# Patient Record
Sex: Female | Born: 2000
Health system: Southern US, Community
[De-identification: ages and names within clinical notes are randomized; demographics above are authoritative.]

## PROBLEM LIST (undated history)

## (undated) DIAGNOSIS — L0291 Cutaneous abscess, unspecified: Secondary | ICD-10-CM

## (undated) DIAGNOSIS — D649 Anemia, unspecified: Secondary | ICD-10-CM

## (undated) DIAGNOSIS — M24859 Other specific joint derangements of unspecified hip, not elsewhere classified: Secondary | ICD-10-CM

## (undated) DIAGNOSIS — I7300 Raynaud's syndrome without gangrene: Secondary | ICD-10-CM

## (undated) DIAGNOSIS — A6920 Lyme disease, unspecified: Secondary | ICD-10-CM

## (undated) DIAGNOSIS — E58 Dietary calcium deficiency: Secondary | ICD-10-CM

## (undated) DIAGNOSIS — M419 Scoliosis, unspecified: Secondary | ICD-10-CM

## (undated) HISTORY — PX: TONSILLECTOMY: SUR1361

## (undated) HISTORY — PX: ADENOIDECTOMY: SUR15

---
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
## 2019-01-25 ENCOUNTER — Ambulatory Visit (HOSPITAL_COMMUNITY)
Admission: EM | Admit: 2019-01-25 | Discharge: 2019-01-25 | Disposition: A | Discharge status: Home / Self Care | Payer: BC Managed Care – PPO | Attending: Family Medicine | Admitting: Family Medicine

## 2019-01-25 ENCOUNTER — Other Ambulatory Visit: Payer: Self-pay

## 2019-01-25 ENCOUNTER — Encounter (HOSPITAL_COMMUNITY): Payer: Self-pay | Admitting: *Deleted

## 2019-01-25 DIAGNOSIS — L03011 Cellulitis of right finger: Secondary | ICD-10-CM

## 2019-01-25 MED ORDER — SULFAMETHOXAZOLE-TRIMETHOPRIM 800-160 MG PO TABS: 1.0000 | ORAL_TABLET | Freq: Two times a day (BID) | ORAL | 0 refills | Status: AC

## 2019-01-25 NOTE — Discharge Instructions (Signed)
May keep this dressing on until tomorrow.  Warm compresses regularly, 3-4 times a day even, this will help promote drainage.  Complete course of antibiotics.  Change dressing daily to keep covered and clean.  Ibuprofen as needed for pain.  If symptoms worsen or do not improve in the next week to return to be seen or to follow up with your PCP.

## 2019-01-25 NOTE — ED Triage Notes (Signed)
C/O infection to right little finger x 2 days; paronychia noted.  States started after having artificial nails applied.

## 2019-01-25 NOTE — ED Provider Notes (Signed)
[facility]    CSN: [id number] Arrival date & time: 01/25/19  1009      History   Chief Complaint Chief Complaint  Patient presents with  . Hand Pain    HPI Amy Daugherty is a 18 y.o. female.   Amy Daugherty presents with complaints of pain, swelling and infection to right hand pinky finger. First noted it two days ago. She feels the swelling has mildly improved today. She had gotten her nails done at the salon a week ago. States she does bite her nails. No significant drainage but does see some drainage on her bandage. Pain 10/10, radiates down the finger to the hand. Pad of finger is still soft with minimal tenderness. Has had similar in the past. History of anemia, lyme disease, raynaud, scoliosis.     ROS per HPI, negative if not otherwise mentioned.      Past Medical History:  Diagnosis Date  . Anemia   . Lyme disease   . Raynaud disease   . Scoliosis   . Snapping hip syndrome     There are no active problems to display for this patient.   Past Surgical History:  Procedure Laterality Date  . ADENOIDECTOMY      OB History   No obstetric history on file.      Home Medications    Prior to Admission medications   Medication Sig Start Date End Date Taking? Authorizing Provider  acetaminophen (TYLENOL) 500 MG tablet Take 500 mg by mouth every 6 (six) hours as needed for pain.    [provider]  cetirizine-pseudoephedrine (ZYRTEC-D) 5-120 MG tablet Take 1 tablet by mouth daily. 05/05/17   Tasia Catchings, Amy V, PA-C  fluticasone (FLONASE) 50 MCG/ACT nasal spray Place 2 sprays into both nostrils daily. 05/05/17   Ok Edwards, PA-C  ibuprofen (ADVIL,MOTRIN) 600 MG tablet Take 1 tab PO Q6h x 3 days then Q6h prn 05/25/12   Kristen Cardinal, NP  naproxen sodium (ANAPROX) 220 MG tablet Take 220 mg by mouth 2 (two) times daily with a meal.    [provider]  phenol (CHLORASEPTIC) 1.4 % LIQD Use as directed 1 spray in the mouth or throat as needed for  throat irritation / pain. 03/03/17   Tasia Catchings, Amy V, PA-C  sulfamethoxazole-trimethoprim (BACTRIM DS) 800-160 MG tablet Take 1 tablet by mouth 2 (two) times daily for 7 days. 01/25/19 02/01/19  Zigmund Gottron, NP    Family History Family History  Problem Relation Age of Onset  . Hypertension Mother   . Ulcerative colitis Mother     Social History Social History   Tobacco Use  . Smoking status: Never Smoker  . Smokeless tobacco: Never Used  Substance Use Topics  . Alcohol use: Yes    Comment: occasionally  . Drug use: Yes    Types: Marijuana     Allergies   Amoxicillin   Review of Systems Review of Systems   Physical Exam Triage Vital Signs ED Triage Vitals  Enc Vitals Group     BP 01/25/19 1034 123/73     Pulse Rate 01/25/19 1034 85     Resp 01/25/19 1034 16     Temp 01/25/19 1034 98.2 F (36.8 C)     Temp Source 01/25/19 1034 Other     SpO2 01/25/19 1034 97 %     Weight --      Height --      Head Circumference --      Peak  Flow --      Pain Score 01/25/19 1036 9     Pain Loc --      Pain Edu? --      Excl. in GC? --    No data found.  Updated Vital Signs BP 123/73   Pulse 85   Temp 98.2 F (36.8 C) (Other (Comment))   Resp 16   SpO2 97%   Visual Acuity Right Eye Distance:   Left Eye Distance:   Bilateral Distance:    Right Eye Near:   Left Eye Near:    Bilateral Near:     Physical Exam Constitutional:      General: She is not in acute distress.    Appearance: She is well-developed.  Cardiovascular:     Rate and Rhythm: Normal rate.  Pulmonary:     Effort: Pulmonary effort is normal.  Musculoskeletal:     Comments: Right pinky with obvious and significant paronychia with pus collection surrounding nail bed with large swelling and redness; pad swollen yet remains soft   Skin:    General: Skin is warm and dry.  Neurological:     Mental Status: She is alert and oriented to person, place, and time.      UC Treatments / Results  Labs  (all labs ordered are listed, but only abnormal results are displayed) Labs Reviewed - No data to display  EKG   Radiology No results found.  Procedures Incision and Drainage  Date/Time: 01/25/2019 11:15 AM Performed by: Georgetta HaberBurky, Clemmie Marxen B, NP Authorized by: Elvina SidleLauenstein, Kurt, MD   Consent:    Consent obtained:  Verbal   Consent given by:  Patient   Risks discussed:  Bleeding, pain, infection and incomplete drainage   Alternatives discussed:  Alternative treatment, observation, referral and no treatment Location:    Type:  Abscess   Location:  Upper extremity   Upper extremity location:  Finger   Finger location:  L small finger Pre-procedure details:    Skin preparation:  Antiseptic wash Anesthesia (see MAR for exact dosages):    Anesthesia method:  Topical application   Topical anesthesia: pain ease freeze spray  Procedure type:    Complexity:  Simple Procedure details:    Incision types:  Stab incision   Scalpel blade:  11   Drainage:  Purulent and bloody   Drainage amount:  Copious   Wound treatment:  Wound left open   Packing materials:  None Post-procedure details:    Patient tolerance of procedure:  Tolerated well, no immediate complications   (including critical care time)  Medications Ordered in UC Medications - No data to display  Initial Impression / Assessment and Plan / UC Course  I have reviewed the triage vital signs and the nursing notes.  Pertinent labs & imaging results that were available during my care of the patient were reviewed by me and considered in my medical decision making (see chart for details).     Significant output from paronychia s/p stab incision, patient tolerated well. Wound care discussed. Bactrim provided. Return precautions provided. Patient verbalized understanding and agreeable to plan.   Final Clinical Impressions(s) / UC Diagnoses   Final diagnoses:  Paronychia of finger of right hand     Discharge Instructions      May keep this dressing on until tomorrow.  Warm compresses regularly, 3-4 times a day even, this will help promote drainage.  Complete course of antibiotics.  Change dressing daily to keep covered and clean.  Ibuprofen  as needed for pain.  If symptoms worsen or do not improve in the next week to return to be seen or to follow up with your PCP.     ED Prescriptions    Medication Sig Dispense Auth. Provider   sulfamethoxazole-trimethoprim (BACTRIM DS) 800-160 MG tablet Take 1 tablet by mouth 2 (two) times daily for 7 days. 14 tablet Georgetta Haber, NP     PDMP not reviewed this encounter.   Georgetta Haber, NP 01/25/19 1119

## 2019-02-16 DIAGNOSIS — F4322 Adjustment disorder with anxiety: Secondary | ICD-10-CM | POA: Diagnosis not present

## 2019-03-02 DIAGNOSIS — F4322 Adjustment disorder with anxiety: Secondary | ICD-10-CM | POA: Diagnosis not present

## 2019-03-16 DIAGNOSIS — F4322 Adjustment disorder with anxiety: Secondary | ICD-10-CM | POA: Diagnosis not present

## 2019-03-23 DIAGNOSIS — F4322 Adjustment disorder with anxiety: Secondary | ICD-10-CM | POA: Diagnosis not present

## 2019-04-06 DIAGNOSIS — F4322 Adjustment disorder with anxiety: Secondary | ICD-10-CM | POA: Diagnosis not present

## 2019-04-20 DIAGNOSIS — F4322 Adjustment disorder with anxiety: Secondary | ICD-10-CM | POA: Diagnosis not present

## 2019-05-12 DIAGNOSIS — F4322 Adjustment disorder with anxiety: Secondary | ICD-10-CM | POA: Diagnosis not present

## 2019-05-18 DIAGNOSIS — F4322 Adjustment disorder with anxiety: Secondary | ICD-10-CM | POA: Diagnosis not present

## 2019-07-11 ENCOUNTER — Ambulatory Visit: Payer: BC Managed Care – PPO | Attending: Internal Medicine

## 2019-07-11 DIAGNOSIS — Z23 Encounter for immunization: Secondary | ICD-10-CM | POA: Insufficient documentation

## 2019-07-11 NOTE — Progress Notes (Signed)
   Covid-19 Vaccination Clinic  Name:  Amy Daugherty    MRN: [id number] DOB: 03/10/2001  07/11/2019  [name] was observed post Covid-19 immunization for 15 minutes without incident. She was provided with Vaccine Information Sheet and instruction to access the V-Safe system.   [name] was instructed to call 911 with any severe reactions post vaccine: Marland Kitchen Difficulty breathing  . Swelling of face and throat  . A fast heartbeat  . A bad rash all over body  . Dizziness and weakness   Immunizations Administered    Name Date Dose VIS Date Route   Pfizer COVID-19 Vaccine 07/11/2019 11:35 AM 0.3 mL 04/17/2019 Intramuscular   Manufacturer: [employer], [employer]   Lot: [id number]   NDC: [address]-4

## 2019-08-01 ENCOUNTER — Ambulatory Visit: Payer: BC Managed Care – PPO | Attending: Internal Medicine

## 2019-08-01 DIAGNOSIS — Z23 Encounter for immunization: Secondary | ICD-10-CM

## 2019-08-01 NOTE — Progress Notes (Signed)
   Covid-19 Vaccination Clinic  Name:  Amy Daugherty    MRN: [id number] DOB: 12/24/2000  08/01/2019  [name] was observed post Covid-19 immunization for 15 minutes without incident. She was provided with Vaccine Information Sheet and instruction to access the V-Safe system.   [name] was instructed to call 911 with any severe reactions post vaccine: Marland Kitchen Difficulty breathing  . Swelling of face and throat  . A fast heartbeat  . A bad rash all over body  . Dizziness and weakness   Immunizations Administered    Name Date Dose VIS Date Route   Pfizer COVID-19 Vaccine 08/01/2019  1:09 PM 0.3 mL 04/17/2019 Intramuscular   Manufacturer: [employer], [employer]   Lot: [id number]   NDC: [address]-1

## 2019-10-16 DIAGNOSIS — Z3009 Encounter for other general counseling and advice on contraception: Secondary | ICD-10-CM | POA: Diagnosis not present

## 2019-10-16 DIAGNOSIS — R102 Pelvic and perineal pain: Secondary | ICD-10-CM | POA: Diagnosis not present

## 2019-10-16 DIAGNOSIS — Z30432 Encounter for removal of intrauterine contraceptive device: Secondary | ICD-10-CM | POA: Diagnosis not present

## 2019-11-19 ENCOUNTER — Other Ambulatory Visit: Payer: Self-pay

## 2019-11-19 ENCOUNTER — Ambulatory Visit (HOSPITAL_COMMUNITY)
Admission: EM | Admit: 2019-11-19 | Discharge: 2019-11-19 | Disposition: A | Discharge status: Home / Self Care | Payer: BC Managed Care – PPO

## 2019-11-19 ENCOUNTER — Encounter (HOSPITAL_COMMUNITY): Payer: Self-pay

## 2019-11-19 DIAGNOSIS — L02411 Cutaneous abscess of right axilla: Secondary | ICD-10-CM | POA: Diagnosis not present

## 2019-11-19 DIAGNOSIS — M79621 Pain in right upper arm: Secondary | ICD-10-CM

## 2019-11-19 MED ORDER — DOXYCYCLINE HYCLATE 100 MG PO CAPS: 100.0000 mg | ORAL_CAPSULE | Freq: Two times a day (BID) | ORAL | 0 refills | Status: DC

## 2019-11-19 MED ORDER — NAPROXEN 500 MG PO TABS: 500.0000 mg | ORAL_TABLET | Freq: Two times a day (BID) | ORAL | 0 refills | Status: DC

## 2019-11-19 NOTE — ED Triage Notes (Signed)
Pt is here with a abscess under her right armpit that staretd 4 days ago, pt has used boil ease to relieve discomfort.

## 2019-11-19 NOTE — ED Provider Notes (Signed)
[facility]   MRN: [id number] DOB: [birth year]  Subjective:   Amy Daugherty is a 19 y.o. female presenting for 4-day history of worsening boil of the right armpit.  Patient has been using topical treatment with minimal relief.  Feels the pain radiating outwardly from the boil.  Has not gotten it to drain.  Has never had to have incision and drainage before.  No current facility-administered medications for this encounter.  Current Outpatient Medications:  .  acetaminophen (TYLENOL) 500 MG tablet, Take 500 mg by mouth every 6 (six) hours as needed for pain., Disp: , Rfl:  .  cetirizine-pseudoephedrine (ZYRTEC-D) 5-120 MG tablet, Take 1 tablet by mouth daily., Disp: 15 tablet, Rfl: 0 .  fluticasone (FLONASE) 50 MCG/ACT nasal spray, Place 2 sprays into both nostrils daily., Disp: 1 g, Rfl: 0 .  ibuprofen (ADVIL,MOTRIN) 600 MG tablet, Take 1 tab PO Q6h x 3 days then Q6h prn, Disp: 30 tablet, Rfl: 0 .  naproxen sodium (ANAPROX) 220 MG tablet, Take 220 mg by mouth 2 (two) times daily with a meal., Disp: , Rfl:  .  phenol (CHLORASEPTIC) 1.4 % LIQD, Use as directed 1 spray in the mouth or throat as needed for throat irritation / pain., Disp: 1 Bottle, Rfl: 0   Allergies  Allergen Reactions  . Amoxicillin Hives    Past Medical History:  Diagnosis Date  . Anemia   . Lyme disease   . Raynaud disease   . Scoliosis   . Snapping hip syndrome      Past Surgical History:  Procedure Laterality Date  . ADENOIDECTOMY      Family History  Problem Relation Age of Onset  . Hypertension Mother   . Ulcerative colitis Mother     Social History   Tobacco Use  . Smoking status: Never Smoker  . Smokeless tobacco: Never Used  Vaping Use  . Vaping Use: Every day  Substance Use Topics  . Alcohol use: Yes    Comment: occasionally  . Drug use: Yes    Types: Marijuana    ROS   Objective:   Vitals: BP 114/70 (BP Location: Left Arm)   Pulse 89   Temp 98.2 F (36.8 C) (Oral)    Resp 17   Wt 149 lb 9.6 oz (67.9 kg)   SpO2 99%   Physical Exam Constitutional:      General: She is not in acute distress.    Appearance: Normal appearance. She is well-developed. She is not ill-appearing, toxic-appearing or diaphoretic.  HENT:     Head: Normocephalic and atraumatic.     Nose: Nose normal.     Mouth/Throat:     Mouth: Mucous membranes are moist.     Pharynx: Oropharynx is clear.  Eyes:     General: No scleral icterus.       Right eye: No discharge.        Left eye: No discharge.     Extraocular Movements: Extraocular movements intact.     Pupils: Pupils are equal, round, and reactive to light.  Cardiovascular:     Rate and Rhythm: Normal rate.  Pulmonary:     Effort: Pulmonary effort is normal.  Skin:    General: Skin is warm and dry.     Comments: ~2cm abscess with central fluctuance of right axillary region.  There is warmth, erythema and tenderness.  Neurological:     General: No focal deficit present.     Mental Status: She is alert and oriented  to person, place, and time.  Psychiatric:        Mood and Affect: Mood normal.        Behavior: Behavior normal.        Thought Content: Thought content normal.        Judgment: Judgment normal.     PROCEDURE NOTE: I&D of Abscess Verbal consent obtained. Local anesthesia with 3cc of 2% lidocaine with epinephrine. Site cleansed with chlorhexidine. Incision of 1cm was made using a 11 blade, discharge of ~3cc pus and serosanguinous fluid. Wound cavity was explored and loculations loosened using curved hemostats. Cleansed and dressed.   Assessment and Plan :   PDMP not reviewed this encounter.  1. Abscess of axilla, right   2. Axillary pain, right    Abscesses successfully drained.  No packing was placed.  Counseled patient on wound care.  Patient is to start doxycycline, naproxen for pain and inflammation. Counseled patient on potential for adverse effects with medications prescribed/recommended today, ER  and return-to-clinic precautions discussed, patient verbalized understanding.   Wallis Bamberg, [place] 11/20/19 [phone]

## 2019-11-19 NOTE — Discharge Instructions (Addendum)
Please change your dressing 2-3 times daily. Do not apply any ointments or creams. Each time you change your dressing, make sure you clean gently around the perimeter of the wound with gentle soap and warm water. Pat your wound dry and let it air out if possible for 1-2 hours before reapplying another dressing.  °

## 2019-12-18 DIAGNOSIS — Z01419 Encounter for gynecological examination (general) (routine) without abnormal findings: Secondary | ICD-10-CM | POA: Diagnosis not present

## 2020-04-10 ENCOUNTER — Ambulatory Visit (INDEPENDENT_AMBULATORY_CARE_PROVIDER_SITE_OTHER): Payer: BLUE CROSS/BLUE SHIELD

## 2020-04-10 ENCOUNTER — Other Ambulatory Visit: Payer: Self-pay

## 2020-04-10 ENCOUNTER — Encounter (HOSPITAL_COMMUNITY): Payer: Self-pay | Admitting: *Deleted

## 2020-04-10 ENCOUNTER — Ambulatory Visit (HOSPITAL_COMMUNITY)
Admission: EM | Admit: 2020-04-10 | Discharge: 2020-04-10 | Disposition: A | Discharge status: Home / Self Care | Payer: BLUE CROSS/BLUE SHIELD

## 2020-04-10 DIAGNOSIS — L02411 Cutaneous abscess of right axilla: Secondary | ICD-10-CM

## 2020-04-10 DIAGNOSIS — M79675 Pain in left toe(s): Secondary | ICD-10-CM

## 2020-04-10 DIAGNOSIS — S92532A Displaced fracture of distal phalanx of left lesser toe(s), initial encounter for closed fracture: Secondary | ICD-10-CM | POA: Diagnosis not present

## 2020-04-10 HISTORY — DX: Cutaneous abscess, unspecified: L02.91

## 2020-04-10 HISTORY — DX: Dietary calcium deficiency: E58

## 2020-04-10 MED ORDER — DOXYCYCLINE HYCLATE 100 MG PO CAPS: 100.0000 mg | ORAL_CAPSULE | Freq: Two times a day (BID) | ORAL | 0 refills | Status: DC

## 2020-04-10 NOTE — ED Provider Notes (Signed)
[place]    CSN: [id number] Arrival date & time: 04/10/20  1003      History   Chief Complaint Chief Complaint  Patient presents with  . Abscess    HPI Amy Daugherty is a 19 y.o. female.   Presenting today with a painful red mass under right arm below axilla that has been worsening for over a week. Has been doing warm compresses to the area and has expressed a fair amount of thick drainage so far. Denies fever, chills, sweats, rashes elsewhere. No injury to the area. Also having some left 5th toe pain for over a week after reportedly breaking the toe and pushing it back into place at home. Has continued stiffness and swelling in this toe now but no discoloration, numbness, tingling. Hx of raynauds but this toe feels different than just that.      Past Medical History:  Diagnosis Date  . Abscess   . Anemia   . Calcium deficiency   . Lyme disease   . Raynaud disease   . Scoliosis   . Snapping hip syndrome     There are no problems to display for this patient.   Past Surgical History:  Procedure Laterality Date  . ADENOIDECTOMY    . TONSILLECTOMY      OB History   No obstetric history on file.      Home Medications    Prior to Admission medications   Medication Sig Start Date End Date Taking? Authorizing Provider  Aspirin-Acetaminophen-Caffeine (EXCEDRIN PO) Take by mouth.   Yes [provider]  acetaminophen (TYLENOL) 500 MG tablet Take 500 mg by mouth every 6 (six) hours as needed for pain.    [provider]  cetirizine-pseudoephedrine (ZYRTEC-D) 5-120 MG tablet Take 1 tablet by mouth daily. 05/05/17   Ok Edwards, PA-C  COVID-19 Specimen Collection KIT TEST AS DIRECETD 06/08/19   [provider]  doxycycline (VIBRAMYCIN) 100 MG capsule Take 1 capsule (100 mg total) by mouth 2 (two) times daily. 04/10/20   Volney American, PA-C  fluticasone [facility]) 50 MCG/ACT nasal spray Place 2 sprays into both nostrils daily.  05/05/17   Ok Edwards, PA-C  ibuprofen (ADVIL,MOTRIN) 600 MG tablet Take 1 tab PO Q6h x 3 days then Q6h prn 05/25/12   Kristen Cardinal, NP  Multiple Vitamin (MULTIVITAMIN) capsule Take 1 capsule by mouth daily.    [provider]  naproxen (NAPROSYN) 500 MG tablet Take 1 tablet (500 mg total) by mouth 2 (two) times daily with a meal. 11/19/19   Jaynee Eagles, PA-C  naproxen sodium (ANAPROX) 220 MG tablet Take 220 mg by mouth 2 (two) times daily with a meal.    [provider]  phenol (CHLORASEPTIC) 1.4 % LIQD Use as directed 1 spray in the mouth or throat as needed for throat irritation / pain. 03/03/17   Tasia Catchings, Amy V, PA-C  PHEXXI 1.8-1-0.4 % GEL Place vaginally as directed. 10/19/19   [provider]    Family History Family History  Problem Relation Age of Onset  . Hypertension Mother   . Ulcerative colitis Mother   . Healthy Father     Social History Social History   Tobacco Use  . Smoking status: Never Smoker  . Smokeless tobacco: Never Used  Vaping Use  . Vaping Use: Every day  Substance Use Topics  . Alcohol use: Yes    Comment: occasionally  . Drug use: Yes    Types: Marijuana  Allergies   Amoxicillin   Review of Systems Review of Systems PER HPI    Physical Exam Triage Vital Signs ED Triage Vitals  Enc Vitals Group     BP 04/10/20 1026 136/85     Pulse Rate 04/10/20 1026 85     Resp 04/10/20 1026 16     Temp 04/10/20 1026 98.2 F (36.8 C)     Temp Source 04/10/20 1026 Oral     SpO2 04/10/20 1026 95 %     Weight --      Height --      Head Circumference --      Peak Flow --      Pain Score 04/10/20 1027 3     Pain Loc --      Pain Edu? --      Excl. in [place]? --    No data found.  Updated Vital Signs BP 136/85   Pulse 85   Temp 98.2 F (36.8 C) (Oral)   Resp 16   LMP 04/08/2020 (Exact Date)   SpO2 95%   Visual Acuity Right Eye Distance:   Left Eye Distance:   Bilateral Distance:    Right Eye Near:   Left Eye Near:     Bilateral Near:     Physical Exam Vitals and nursing note reviewed.  Constitutional:      Appearance: Normal appearance. She is not ill-appearing.  HENT:     Head: Atraumatic.  Eyes:     Extraocular Movements: Extraocular movements intact.     Conjunctiva/sclera: Conjunctivae normal.  Cardiovascular:     Rate and Rhythm: Normal rate and regular rhythm.     Heart sounds: Normal heart sounds.  Pulmonary:     Effort: Pulmonary effort is normal.     Breath sounds: Normal breath sounds.  Musculoskeletal:        General: Swelling (diffuse swelling, stiffness and mild darkened coloration of left 5th toe) and signs of injury present. Normal range of motion.     Cervical back: Normal range of motion and neck supple.  Skin:    General: Skin is warm and dry.     Comments: 1 cm x 2 cm fluctuant mass below right axilla, ttp  Neurological:     Mental Status: She is alert and oriented to person, place, and time.  Psychiatric:        Mood and Affect: Mood normal.        Thought Content: Thought content normal.        Judgment: Judgment normal.      UC Treatments / Results  Labs (all labs ordered are listed, but only abnormal results are displayed) Labs Reviewed - No data to display  EKG   Radiology DG Toe 5th Left  Result Date: 04/10/2020 CLINICAL DATA:  One week of fifth toe pain. Deformity and stiffness. EXAM: DG TOE 5TH LEFT COMPARISON:  None. 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 Probable subtle fracture through the base of the proximal fifth phalanx best seen on the AP view. No other abnormalities. 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 Probable subtle fracture through the base of the proximal fifth phalanx only seen on the AP view. Electronically Signed   By: Dorise Bullion III M.D   On: 04/10/2020 11:27     Procedures Incision and Drainage  Date/Time: 04/10/2020 3:11 PM Performed by: Orene Desanctis,  Lilia Argue, PA-C Authorized by: Volney American, PA-C   Consent:    Consent obtained:  Verbal   Consent given by:  Patient   Risks discussed:  Incomplete drainage, infection, bleeding and pain   Alternatives discussed:  Alternative treatment Location:    Type:  Abscess   Location: right axilla. Pre-procedure details:    Skin preparation:  Chloraprep Anesthesia (see MAR for exact dosages):    Anesthesia method:  Local infiltration   Local anesthetic:  Lidocaine 1% WITH epi Procedure type:    Complexity:  Simple Procedure details:    Needle aspiration: no     Incision types:  Stab incision   Incision depth:  Dermal   Scalpel blade:  11   Wound management:  Probed and deloculated   Drainage:  Purulent   Drainage amount:  Moderate   Wound treatment:  Wound left open   Packing materials:  None Post-procedure details:    Patient tolerance of procedure:  Tolerated well, no immediate complications   (including critical care time)  Medications Ordered in UC Medications - No data to display  Initial Impression / Assessment and Plan / UC Course  I have reviewed the triage vital signs and the nursing notes.  Pertinent labs & imaging results that were available during my care of the patient were reviewed by me and considered in my medical decision making (see chart for details).     Abscess drained without complication, start doxycycline, hibiclens and wound care at home as discussed. X-ray left toe showing non displaced healing fracture. Discussed RICE, podiatry f/u if pain does not improve over time.   Final Clinical Impressions(s) / UC Diagnoses   Final diagnoses:  Abscess of axilla, right  Toe pain, left   Discharge Instructions   None    ED Prescriptions    Medication Sig Dispense Auth. Provider   doxycycline (VIBRAMYCIN) 100 MG capsule Take 1 capsule (100 mg  total) by mouth 2 (two) times daily. 14 capsule Volney American, [place]     PDMP not reviewed this encounter.   Volney American, [place] 04/10/20 1513

## 2020-04-10 NOTE — ED Triage Notes (Signed)
Pt c/o right axillary abscess onset approx 10 days ago.  States she has been draining out a great deal of infection.  Denies fevers.  States had abscess to same area approx 1 month ago with I&D.

## 2020-07-12 HISTORY — PX: DENTAL SURGERY: SHX609

## 2020-07-17 ENCOUNTER — Ambulatory Visit (HOSPITAL_COMMUNITY)
Admission: EM | Admit: 2020-07-17 | Discharge: 2020-07-17 | Disposition: A | Discharge status: Home / Self Care | Payer: BC Managed Care – PPO

## 2020-07-17 ENCOUNTER — Encounter (HOSPITAL_COMMUNITY): Payer: Self-pay | Admitting: Emergency Medicine

## 2020-07-17 DIAGNOSIS — K0889 Other specified disorders of teeth and supporting structures: Secondary | ICD-10-CM

## 2020-07-17 MED ORDER — LIDOCAINE VISCOUS HCL 2 % MT SOLN: 10.0000 mL | OROMUCOSAL | 0 refills | Status: DC | PRN

## 2020-07-17 NOTE — ED Triage Notes (Addendum)
Pt presents today with mouth pain that began after she had both wisdom teeth pulled on 07/12/20. She also mentioned striking her head on a cabinet yesterday, but feels she does not have a concussion, but has had a history of seven concussions in the past and knows how to handle them.

## 2020-07-17 NOTE — Discharge Instructions (Signed)
Follow-up with oral surgeon in the next 2 days for recheck

## 2020-07-17 NOTE — ED Provider Notes (Signed)
[place]    CSN: [id number] Arrival date & time: 07/17/20  1154      History   Chief Complaint Chief Complaint  Patient presents with  . Dental Pain    HPI Amy Daugherty is a 20 y.o. female.   Patient here today with 1-2 day history of acutely worsening dental pain radiating up to ears and down neck.  She states she had her wisdom teeth excised through oral surgery 5 days ago and was improving up until 2 days ago.  The pain is constant, throbbing, dull, severe.  She is currently on clindamycin and ibuprofen per oral surgeon, taking faithfully as prescribed.  She denies fever, chills, drainage in her mouth, difficulty swallowing, nausea or vomiting.  No known history of similar pain in the past.  Has had tonsils and adenoids removed in the past.     Past Medical History:  Diagnosis Date  . Abscess   . Anemia   . Calcium deficiency   . Lyme disease   . Raynaud disease   . Scoliosis   . Snapping hip syndrome     There are no problems to display for this patient.   Past Surgical History:  Procedure Laterality Date  . ADENOIDECTOMY    . DENTAL SURGERY  07/12/2020  . TONSILLECTOMY      OB History   No obstetric history on file.      Home Medications    Prior to Admission medications   Medication Sig Start Date End Date Taking? Authorizing Provider  clindamycin (CLEOCIN) 300 MG capsule Take 300 mg by mouth every 8 (eight) hours. 07/12/20  Yes [provider]  dexamethasone (DECADRON) 4 MG tablet Take 4 mg by mouth 3 (three) times daily. 07/12/20  Yes [provider]  HYDROcodone-acetaminophen (NORCO/VICODIN) 5-325 MG tablet Take 1 tablet by mouth every 4 (four) hours as needed. 07/12/20  Yes [provider]  ibuprofen (ADVIL,MOTRIN) 600 MG tablet Take 1 tab PO Q6h x 3 days then Q6h prn 05/25/12  Yes Brewer, Mindy, NP  lidocaine (XYLOCAINE) 2 % solution Use as directed 10 mLs in the mouth or throat as needed for mouth pain. 07/17/20   Yes Volney American, PA-C  acetaminophen (TYLENOL) 500 MG tablet Take 500 mg by mouth every 6 (six) hours as needed for pain.    [provider]  Aspirin-Acetaminophen-Caffeine (EXCEDRIN PO) Take by mouth.    [provider]  cetirizine-pseudoephedrine (ZYRTEC-D) 5-120 MG tablet Take 1 tablet by mouth daily. 05/05/17   Ok Edwards, PA-C  COVID-19 Specimen Collection KIT TEST AS DIRECETD 06/08/19   [provider]  doxycycline (VIBRAMYCIN) 100 MG capsule Take 1 capsule (100 mg total) by mouth 2 (two) times daily. 04/10/20   Volney American, PA-C  fluticasone [facility]) 50 MCG/ACT nasal spray Place 2 sprays into both nostrils daily. 05/05/17   Tasia Catchings, Amy V, PA-C  Multiple Vitamin (MULTIVITAMIN) capsule Take 1 capsule by mouth daily.    [provider]  naproxen (NAPROSYN) 500 MG tablet Take 1 tablet (500 mg total) by mouth 2 (two) times daily with a meal. 11/19/19   Jaynee Eagles, PA-C  naproxen sodium (ANAPROX) 220 MG tablet Take 220 mg by mouth 2 (two) times daily with a meal.    [provider]  phenol (CHLORASEPTIC) 1.4 % LIQD Use as directed 1 spray in the mouth or throat as needed for throat irritation / pain. 03/03/17   Tasia Catchings, Amy V, PA-C  PHEXXI 1.8-1-0.4 %  GEL Place vaginally as directed. 10/19/19   [provider]    Family History Family History  Problem Relation Age of Onset  . Hypertension Mother   . Ulcerative colitis Mother   . Healthy Father     Social History Social History   Tobacco Use  . Smoking status: Never Smoker  . Smokeless tobacco: Never Used  Vaping Use  . Vaping Use: Every day  Substance Use Topics  . Alcohol use: Not Currently  . Drug use: Not Currently    Types: Marijuana     Allergies   Amoxicillin   Review of Systems Review of Systems Per HPI  Physical Exam Triage Vital Signs ED Triage Vitals  Enc Vitals Group     BP 07/17/20 1217 133/76     Pulse Rate 07/17/20 1217 79     Resp  07/17/20 1217 18     Temp 07/17/20 1217 98.1 F (36.7 C)     Temp Source 07/17/20 1217 Oral     SpO2 07/17/20 1217 99 %     Weight 07/17/20 1209 150 lb (68 kg)     Height 07/17/20 1209 5' 9"  (1.753 m)     Head Circumference --      Peak Flow --      Pain Score 07/17/20 1209 9     Pain Loc --      Pain Edu? --      Excl. in [place]? --    No data found.  Updated Vital Signs BP 133/76 (BP Location: Left Arm)   Pulse 79   Temp 98.1 F (36.7 C) (Oral)   Resp 18   Ht 5' 9"  (1.753 m)   Wt 150 lb (68 kg)   LMP 07/16/2020   SpO2 99%   BMI 22.15 kg/m   Visual Acuity Right Eye Distance:   Left Eye Distance:   Bilateral Distance:    Right Eye Near:   Left Eye Near:    Bilateral Near:     Physical Exam Vitals and nursing note reviewed.  Constitutional:      Appearance: Normal appearance. She is not ill-appearing.  HENT:     Head: Atraumatic.     Right Ear: Tympanic membrane and external ear normal.     Left Ear: Tympanic membrane and external ear normal.     Nose: Nose normal.     Mouth/Throat:     Mouth: Mucous membranes are moist.     Pharynx: Oropharynx is clear.     Comments: No gingival erythema, edema, drainage, bleeding or other abnormalities noted within the oral cavity Eyes:     Extraocular Movements: Extraocular movements intact.     Conjunctiva/sclera: Conjunctivae normal.  Cardiovascular:     Rate and Rhythm: Normal rate and regular rhythm.     Heart sounds: Normal heart sounds.  Pulmonary:     Effort: Pulmonary effort is normal.     Breath sounds: Normal breath sounds.  Musculoskeletal:        General: Normal range of motion.     Cervical back: Normal range of motion and neck supple.  Lymphadenopathy:     Cervical: No cervical adenopathy.  Skin:    General: Skin is warm and dry.  Neurological:     Mental Status: She is alert and oriented to person, place, and time.  Psychiatric:        Mood and Affect: Mood normal.        Thought Content: Thought  content normal.  Judgment: Judgment normal.      UC Treatments / Results  Labs (all labs ordered are listed, but only abnormal results are displayed) Labs Reviewed - No data to display  EKG   Radiology No results found.  Procedures Procedures (including critical care time)  Medications Ordered in UC Medications - No data to display  Initial Impression / Assessment and Plan / UC Course  I have reviewed the triage vital signs and the nursing notes.  Pertinent labs & imaging results that were available during my care of the patient were reviewed by me and considered in my medical decision making (see chart for details).     Vital signs and exam very reassuring today, already on antibiotics and anti-inflammatory pain medication through oral surgeon.  No evidence of acute infection today, suspect related to her recent oral surgery.  Will treat with viscous lidocaine additionally for as needed pain relief.  She will contact oral surgeon in the morning and try to be seen given her worsening symptoms.  Continue the medications as prescribed.  Follow-up for acutely worsening symptoms in meantime.  Final Clinical Impressions(s) / UC Diagnoses   Final diagnoses:  Pain, dental     Discharge Instructions     Follow-up with oral surgeon in the next 2 days for recheck    ED Prescriptions    Medication Sig Dispense Auth. Provider   lidocaine (XYLOCAINE) 2 % solution Use as directed 10 mLs in the mouth or throat as needed for mouth pain. 100 mL Volney American, [place]     PDMP not reviewed this encounter.   Volney American, [place] 07/17/20 1358

## 2020-09-16 ENCOUNTER — Ambulatory Visit (HOSPITAL_COMMUNITY)
Admission: EM | Admit: 2020-09-16 | Discharge: 2020-09-16 | Disposition: A | Discharge status: Home / Self Care | Payer: BC Managed Care – PPO | Attending: Emergency Medicine | Admitting: Emergency Medicine

## 2020-09-16 ENCOUNTER — Ambulatory Visit (INDEPENDENT_AMBULATORY_CARE_PROVIDER_SITE_OTHER): Payer: BC Managed Care – PPO

## 2020-09-16 ENCOUNTER — Encounter (HOSPITAL_COMMUNITY): Payer: Self-pay

## 2020-09-16 ENCOUNTER — Other Ambulatory Visit: Payer: Self-pay

## 2020-09-16 DIAGNOSIS — S60413A Abrasion of left middle finger, initial encounter: Secondary | ICD-10-CM

## 2020-09-16 DIAGNOSIS — M79642 Pain in left hand: Secondary | ICD-10-CM | POA: Diagnosis not present

## 2020-09-16 DIAGNOSIS — L03012 Cellulitis of left finger: Secondary | ICD-10-CM | POA: Diagnosis not present

## 2020-09-16 DIAGNOSIS — M79645 Pain in left finger(s): Secondary | ICD-10-CM

## 2020-09-16 DIAGNOSIS — L089 Local infection of the skin and subcutaneous tissue, unspecified: Secondary | ICD-10-CM | POA: Diagnosis not present

## 2020-09-16 DIAGNOSIS — S6992XA Unspecified injury of left wrist, hand and finger(s), initial encounter: Secondary | ICD-10-CM | POA: Diagnosis not present

## 2020-09-16 MED ORDER — DOXYCYCLINE HYCLATE 100 MG PO CAPS: 100.0000 mg | ORAL_CAPSULE | Freq: Two times a day (BID) | ORAL | 0 refills | Status: AC

## 2020-09-16 NOTE — ED Provider Notes (Signed)
[place]    CSN: [id number] Arrival date & time: 09/16/20  3086      History   Chief Complaint Chief Complaint  Patient presents with  . finger infection    HPI Amy Daugherty is a 20 y.o. female.   Patient here for evaluation of left index finger pain and swelling that has been ongoing for the past several days to weeks.  Reports frequently biting nails and gets infections but states that this 1 has not improved.  Reports finger has drained several times.  Reports pain now radiating into finger and hand.  Denies any redness, swelling, or decreased ROM.  Denies any trauma, injury, or other precipitating event.  Denies any specific alleviating or aggravating factors.  Denies any fevers, chest pain, shortness of breath, N/V/D, numbness, tingling, weakness, abdominal pain, or headaches.   ROS: As per HPI, all other pertinent ROS negative    The history is provided by the patient.    Past Medical History:  Diagnosis Date  . Abscess   . Anemia   . Calcium deficiency   . Lyme disease   . Raynaud disease   . Scoliosis   . Snapping hip syndrome     There are no problems to display for this patient.   Past Surgical History:  Procedure Laterality Date  . ADENOIDECTOMY    . DENTAL SURGERY  07/12/2020  . TONSILLECTOMY      OB History   No obstetric history on file.      Home Medications    Prior to Admission medications   Medication Sig Start Date End Date Taking? Authorizing Provider  doxycycline (VIBRAMYCIN) 100 MG capsule Take 1 capsule (100 mg total) by mouth 2 (two) times daily for 10 days. 09/16/20 09/26/20 Yes Pearson Forster, NP  acetaminophen (TYLENOL) 500 MG tablet Take 500 mg by mouth every 6 (six) hours as needed for pain.    [provider]  Aspirin-Acetaminophen-Caffeine (EXCEDRIN PO) Take by mouth.    [provider]  cetirizine-pseudoephedrine (ZYRTEC-D) 5-120 MG tablet Take 1 tablet by mouth daily. 05/05/17   Tasia Catchings, Amy V, PA-C   clindamycin (CLEOCIN) 300 MG capsule Take 300 mg by mouth every 8 (eight) hours. 07/12/20   [provider]  COVID-19 Specimen Collection KIT TEST AS DIRECETD 06/08/19   [provider]  dexamethasone (DECADRON) 4 MG tablet Take 4 mg by mouth 3 (three) times daily. 07/12/20   [provider]  fluticasone (FLONASE) 50 MCG/ACT nasal spray Place 2 sprays into both nostrils daily. 05/05/17   Tasia Catchings, Amy V, PA-C  HYDROcodone-acetaminophen (NORCO/VICODIN) 5-325 MG tablet Take 1 tablet by mouth every 4 (four) hours as needed. 07/12/20   [provider]  ibuprofen (ADVIL,MOTRIN) 600 MG tablet Take 1 tab PO Q6h x 3 days then Q6h prn 05/25/12   Kristen Cardinal, NP  lidocaine (XYLOCAINE) 2 % solution Use as directed 10 mLs in the mouth or throat as needed for mouth pain. 07/17/20   Volney American, PA-C  Multiple Vitamin (MULTIVITAMIN) capsule Take 1 capsule by mouth daily.    [provider]  naproxen (NAPROSYN) 500 MG tablet Take 1 tablet (500 mg total) by mouth 2 (two) times daily with a meal. 11/19/19   Jaynee Eagles, PA-C  naproxen sodium (ANAPROX) 220 MG tablet Take 220 mg by mouth 2 (two) times daily with a meal.    [provider]  phenol (CHLORASEPTIC) 1.4 % LIQD Use as directed 1 spray in the  mouth or throat as needed for throat irritation / pain. 03/03/17   Tasia Catchings, Amy V, PA-C  PHEXXI 1.8-1-0.4 % GEL Place vaginally as directed. 10/19/19   [provider]    Family History Family History  Problem Relation Age of Onset  . Hypertension Mother   . Ulcerative colitis Mother   . Healthy Father     Social History Social History   Tobacco Use  . Smoking status: Never Smoker  . Smokeless tobacco: Never Used  Vaping Use  . Vaping Use: Every day  Substance Use Topics  . Alcohol use: Not Currently  . Drug use: Not Currently    Types: Marijuana     Allergies   Amoxicillin   Review of Systems Review of Systems  Musculoskeletal: Positive  for myalgias.  All other systems reviewed and are negative.    Physical Exam Triage Vital Signs ED Triage Vitals  Enc Vitals Group     BP 09/16/20 0833 129/86     Pulse Rate 09/16/20 0833 94     Resp 09/16/20 0833 19     Temp 09/16/20 0833 98.1 F (36.7 C)     Temp Source 09/16/20 0833 Oral     SpO2 09/16/20 0833 100 %     Weight --      Height --      Head Circumference --      Peak Flow --      Pain Score 09/16/20 0831 4     Pain Loc --      Pain Edu? --      Excl. in [place]? --    No data found.  Updated Vital Signs BP 129/86 (BP Location: Left Arm)   Pulse 94   Temp 98.1 F (36.7 C) (Oral)   Resp 19   LMP 08/15/2020 (Approximate)   SpO2 100%   Visual Acuity Right Eye Distance:   Left Eye Distance:   Bilateral Distance:    Right Eye Near:   Left Eye Near:    Bilateral Near:     Physical Exam Vitals and nursing note reviewed.  Constitutional:      General: She is not in acute distress.    Appearance: Normal appearance. She is not ill-appearing, toxic-appearing or diaphoretic.  HENT:     Head: Normocephalic and atraumatic.  Eyes:     Conjunctiva/sclera: Conjunctivae normal.  Cardiovascular:     Rate and Rhythm: Normal rate.     Pulses: Normal pulses.  Pulmonary:     Effort: Pulmonary effort is normal.  Abdominal:     General: Abdomen is flat.  Musculoskeletal:        General: Normal range of motion.     Cervical back: Normal range of motion.  Skin:    General: Skin is warm and dry.     Findings: Abscess (Paronychia to left index finger) and erythema present.     Comments: See photo  Neurological:     General: No focal deficit present.     Mental Status: She is alert and oriented to person, place, and time.  Psychiatric:        Mood and Affect: Mood normal.        UC Treatments / Results  Labs (all labs ordered are listed, but only abnormal results are displayed) Labs Reviewed - No data to display  EKG   Radiology DG Finger Middle  Left  Result Date: 09/16/2020 CLINICAL DATA:  Infection.  Finger and hand pain. EXAM: LEFT MIDDLE FINGER  2+V COMPARISON:  None. FINDINGS: There is no evidence of fracture or dislocation. There is no evidence of arthropathy or other focal bone abnormality. Soft tissues are unremarkable. IMPRESSION: Negative. Electronically Signed   By: Rolm Baptise M.D.   On: 09/16/2020 09:05    Procedures Procedures (including critical care time)  Medications Ordered in UC Medications - No data to display  Initial Impression / Assessment and Plan / UC Course  I have reviewed the triage vital signs and the nursing notes.  Pertinent labs & imaging results that were available during my care of the patient were reviewed by me and considered in my medical decision making (see chart for details).    Assessment negative for red flags or concerns including felons.  As patient has been having pain that radiates down finger into her hand we will check an x-ray to rule out any significant infection including septic arthritis or osteomyelitis.  X-ray was negative.  Paronychia has been draining on its own at home so we will not attempt to drain it here.  Doxycycline twice daily x10 days.  Continue doing Epsom salt soaks.  May take ibuprofen and/or Tylenol as needed for pain and fever.  May ice finger for comfort.  Recommend follow-up for worsening symptoms. Final Clinical Impressions(s) / UC Diagnoses   Final diagnoses:  Paronychia of left middle finger  Finger pain, left     Discharge Instructions     Take the doxycycline 1 pill twice a day for the next 10 days.  Take ibuprofen and/or Tylenol as needed for pain relief and fever reduction.  Soak your hand in Epson salt soak 3-4 times a day.  You can also ice your hand 3-4 times a day as needed for comfort.  If you develop any  worsening redness, swelling, red streaks, or a fever, please go directly to the emergency room for further evaluation.  Return or go to  the Emergency Department if symptoms worsen or do not improve in the next few days.     ED Prescriptions    Medication Sig Dispense Auth. Provider   doxycycline (VIBRAMYCIN) 100 MG capsule Take 1 capsule (100 mg total) by mouth 2 (two) times daily for 10 days. 20 capsule Pearson Forster, NP     PDMP not reviewed this encounter.   Pearson Forster, NP 09/16/20 [phone]

## 2020-09-16 NOTE — ED Triage Notes (Signed)
Pt in with c/o left finger pain that started last week after she bit her nail  Redness, swelling, and drainage noted

## 2020-09-16 NOTE — Discharge Instructions (Signed)
Take the doxycycline 1 pill twice a day for the next 10 days.  Take ibuprofen and/or Tylenol as needed for pain relief and fever reduction.  Soak your hand in Epson salt soak 3-4 times a day.  You can also ice your hand 3-4 times a day as needed for comfort.  If you develop any  worsening redness, swelling, red streaks, or a fever, please go directly to the emergency room for further evaluation.  Return or go to the Emergency Department if symptoms worsen or do not improve in the next few days.

## 2020-11-15 DIAGNOSIS — Z3202 Encounter for pregnancy test, result negative: Secondary | ICD-10-CM | POA: Diagnosis not present

## 2020-11-15 DIAGNOSIS — Z30430 Encounter for insertion of intrauterine contraceptive device: Secondary | ICD-10-CM | POA: Diagnosis not present

## 2020-12-03 ENCOUNTER — Encounter (HOSPITAL_COMMUNITY): Payer: Self-pay | Admitting: *Deleted

## 2020-12-03 ENCOUNTER — Ambulatory Visit (HOSPITAL_COMMUNITY)
Admission: EM | Admit: 2020-12-03 | Discharge: 2020-12-03 | Disposition: A | Discharge status: Home / Self Care | Payer: BC Managed Care – PPO | Attending: Physician Assistant | Admitting: Physician Assistant

## 2020-12-03 ENCOUNTER — Other Ambulatory Visit: Payer: Self-pay

## 2020-12-03 DIAGNOSIS — K130 Diseases of lips: Secondary | ICD-10-CM

## 2020-12-03 DIAGNOSIS — B370 Candidal stomatitis: Secondary | ICD-10-CM

## 2020-12-03 DIAGNOSIS — L299 Pruritus, unspecified: Secondary | ICD-10-CM | POA: Diagnosis not present

## 2020-12-03 MED ORDER — NYSTATIN 100000 UNIT/ML MT SUSP: 5.0000 mL | Freq: Four times a day (QID) | OROMUCOSAL | 0 refills | Status: AC

## 2020-12-03 NOTE — ED Provider Notes (Signed)
[place]    CSN: [id number] Arrival date & time: 12/03/20  1000      History   Chief Complaint Chief Complaint  Patient presents with   Thrush    HPI Amy Daugherty is a 20 y.o. female.   Patient presents today with a several week history of white patches in her mouth.  Reports that initially she had a sore throat but this has since improved though she continues to have a white film intermittently in her mouth.  She is also developed a sore at her left lateral commissure and is concerned for yeast infection as she has had similar symptoms in different areas of her body in the past that resolved with antifungals.  She has tried Aquaphor and tea tree oil without improvement of symptoms.  She is also been drinking tea.  She denies any history of immunosuppression including HIV or diabetes.  She denies any difficulty swallowing, shortness of breath, fever, nausea, vomiting.  She denies any recent antibiotic use or steroid use.  She does report widespread pruritus and is unsure if this is related.  She does have anxiety and often has itching his sensation related to anxiety but states this has been more severe recently.   Past Medical History:  Diagnosis Date   Abscess    Anemia    Calcium deficiency    Lyme disease    Raynaud disease    Scoliosis    Snapping hip syndrome     There are no problems to display for this patient.   Past Surgical History:  Procedure Laterality Date   ADENOIDECTOMY     DENTAL SURGERY  07/12/2020   TONSILLECTOMY      OB History   No obstetric history on file.      Home Medications    Prior to Admission medications   Medication Sig Start Date End Date Taking? Authorizing Provider  nystatin (MYCOSTATIN) 100000 UNIT/ML suspension Take 5 mLs (500,000 Units total) by mouth 4 (four) times daily. 12/03/20  Yes Buford Gayler K, PA-C  cetirizine-pseudoephedrine (ZYRTEC-D) 5-120 MG tablet Take 1 tablet by mouth daily. 05/05/17   Tasia Catchings, Amy V,  PA-C  clindamycin (CLEOCIN) 300 MG capsule Take 300 mg by mouth every 8 (eight) hours. 07/12/20   [provider]  COVID-19 Specimen Collection KIT TEST AS DIRECETD 06/08/19   [provider]  dexamethasone (DECADRON) 4 MG tablet Take 4 mg by mouth 3 (three) times daily. 07/12/20   [provider]  fluticasone (FLONASE) 50 MCG/ACT nasal spray Place 2 sprays into both nostrils daily. 05/05/17   Ok Edwards, PA-C  ibuprofen (ADVIL,MOTRIN) 600 MG tablet Take 1 tab PO Q6h x 3 days then Q6h prn 05/25/12   Kristen Cardinal, NP  phenol (CHLORASEPTIC) 1.4 % LIQD Use as directed 1 spray in the mouth or throat as needed for throat irritation / pain. 03/03/17   Ok Edwards, PA-C    Family History Family History  Problem Relation Age of Onset   Hypertension Mother    Ulcerative colitis Mother    Healthy Father     Social History Social History   Tobacco Use   Smoking status: Never   Smokeless tobacco: Never  Vaping Use   Vaping Use: Every day  Substance Use Topics   Alcohol use: Not Currently   Drug use: Not Currently    Types: Marijuana     Allergies   Amoxicillin   Review of Systems Review of Systems  Constitutional:  Negative for activity change, appetite change, fatigue and fever.  HENT:  Positive for mouth sores. Negative for congestion, sinus pressure, sneezing and sore throat.   Respiratory:  Negative for cough and shortness of breath.   Cardiovascular:  Negative for chest pain.  Gastrointestinal:  Negative for abdominal pain, diarrhea, nausea and vomiting.    Physical Exam Triage Vital Signs ED Triage Vitals  Enc Vitals Group     BP 12/03/20 1016 122/60     Pulse Rate 12/03/20 1016 100     Resp 12/03/20 1016 18     Temp 12/03/20 1016 98.7 F (37.1 C)     Temp src --      SpO2 12/03/20 1016 100 %     Weight --      Height --      Head Circumference --      Peak Flow --      Pain Score 12/03/20 1017 4     Pain Loc --      Pain Edu? --      Excl.  in [place]? --    No data found.  Updated Vital Signs BP 122/60   Pulse 100   Temp 98.7 F (37.1 C)   Resp 18   SpO2 100%   Visual Acuity Right Eye Distance:   Left Eye Distance:   Bilateral Distance:    Right Eye Near:   Left Eye Near:    Bilateral Near:     Physical Exam Vitals reviewed.  Constitutional:      General: She is awake. She is not in acute distress.    Appearance: Normal appearance. She is normal weight. She is not ill-appearing.     Comments: Very pleasant female appears stated age in no acute distress  HENT:     Head: Normocephalic and atraumatic.     Right Ear: External ear normal.     Left Ear: External ear normal.     Nose:     Right Sinus: No maxillary sinus tenderness or frontal sinus tenderness.     Left Sinus: No maxillary sinus tenderness or frontal sinus tenderness.     Mouth/Throat:     Mouth: Mucous membranes are moist.     Dentition: Normal dentition.     Tongue: Lesions present. Tongue does not deviate from midline.     Pharynx: Uvula midline. No oropharyngeal exudate or posterior oropharyngeal erythema.     Comments: Scattered white patches noted posterior tongue and hard palate.  Fissure noted at oral commissure. Cardiovascular:     Rate and Rhythm: Normal rate and regular rhythm.     Heart sounds: Normal heart sounds, S1 normal and S2 normal. No murmur heard. Pulmonary:     Effort: Pulmonary effort is normal.     Breath sounds: Normal breath sounds. No wheezing, rhonchi or rales.     Comments: Clear to auscultation bilaterally Skin:    Findings: No rash.  Psychiatric:        Behavior: Behavior is cooperative.     UC Treatments / Results  Labs (all labs ordered are listed, but only abnormal results are displayed) Labs Reviewed - No data to display  EKG   Radiology No results found.  Procedures Procedures (including critical care time)  Medications Ordered in UC Medications - No data to display  Initial Impression /  Assessment and Plan / UC Course  I have reviewed the triage vital signs and the nursing notes.  Pertinent labs & imaging results that  were available during my care of the patient were reviewed by me and considered in my medical decision making (see chart for details).      Symptoms consistent with oral thrush.  Patient treated with nystatin.  Discussed potential utility of getting CBC particularly given widespread pruritus to rule out erythrocytosis but patient declined this today.  Discussed the importance of oral hygiene.  Discussed that if symptoms persist would need to consider blood work for further evaluation.  Strict return precautions given to which patient expressed understanding.  Final Clinical Impressions(s) / UC Diagnoses   Final diagnoses:  Oral thrush  Cheilitis  Pruritus     Discharge Instructions      Take nystatin 4 times a day.  Make sure to change your oral hygiene products including her toothbrush.  If you continue to have symptoms it would be worthwhile to be reevaluated to consider lab work as we discussed.  If anything worsens please return for reevaluation.  Follow-up with your primary care provider once you establish with them for further evaluation.     ED Prescriptions     Medication Sig Dispense Auth. Provider   nystatin (MYCOSTATIN) 100000 UNIT/ML suspension Take 5 mLs (500,000 Units total) by mouth 4 (four) times daily. 140 mL Keymani Mclean K, PA-C      PDMP not reviewed this encounter.   Terrilee Croak, PA-C 12/03/20 1053

## 2020-12-03 NOTE — ED Triage Notes (Signed)
Pt reports white film in mouth.

## 2020-12-03 NOTE — Discharge Instructions (Addendum)
Take nystatin 4 times a day.  Make sure to change your oral hygiene products including her toothbrush.  If you continue to have symptoms it would be worthwhile to be reevaluated to consider lab work as we discussed.  If anything worsens please return for reevaluation.  Follow-up with your primary care provider once you establish with them for further evaluation.

## 2021-01-19 ENCOUNTER — Ambulatory Visit (HOSPITAL_COMMUNITY)
Admission: EM | Admit: 2021-01-19 | Discharge: 2021-01-19 | Disposition: A | Discharge status: Home / Self Care | Payer: BC Managed Care – PPO

## 2021-01-19 ENCOUNTER — Other Ambulatory Visit: Payer: Self-pay

## 2021-01-19 DIAGNOSIS — K130 Diseases of lips: Secondary | ICD-10-CM | POA: Diagnosis not present

## 2021-01-19 DIAGNOSIS — B370 Candidal stomatitis: Secondary | ICD-10-CM | POA: Diagnosis not present
